# Patient Record
Sex: Female | Born: 1985 | Race: White | Hispanic: No | Marital: Married | Smoking: Never smoker
Health system: Southern US, Community
[De-identification: ages and names within clinical notes are randomized; demographics above are authoritative.]

## PROBLEM LIST (undated history)

## (undated) HISTORY — PX: ABLATION: SHX5711

## (undated) HISTORY — PX: TUBAL LIGATION: SHX77

## (undated) HISTORY — PX: APPENDECTOMY: SHX54

---

## 2004-10-01 ENCOUNTER — Emergency Department (HOSPITAL_COMMUNITY): Admission: EM | Admit: 2004-10-01 | Discharge: 2004-10-01 | Payer: Self-pay | Admitting: Emergency Medicine

## 2006-04-28 ENCOUNTER — Emergency Department (HOSPITAL_COMMUNITY): Admission: EM | Admit: 2006-04-28 | Discharge: 2006-04-28 | Payer: Self-pay | Admitting: Emergency Medicine

## 2007-12-30 ENCOUNTER — Emergency Department (HOSPITAL_COMMUNITY): Admission: EM | Admit: 2007-12-30 | Discharge: 2007-12-30 | Payer: Self-pay | Admitting: Emergency Medicine

## 2009-06-24 IMAGING — CR DG CHEST 2V
2 series · 2 of 2 positions shown · non-contrast
Comparison: None

CLINICAL DATA: Chest pain, shortness of breath

CHEST - 2 VIEW

[w chest pa]
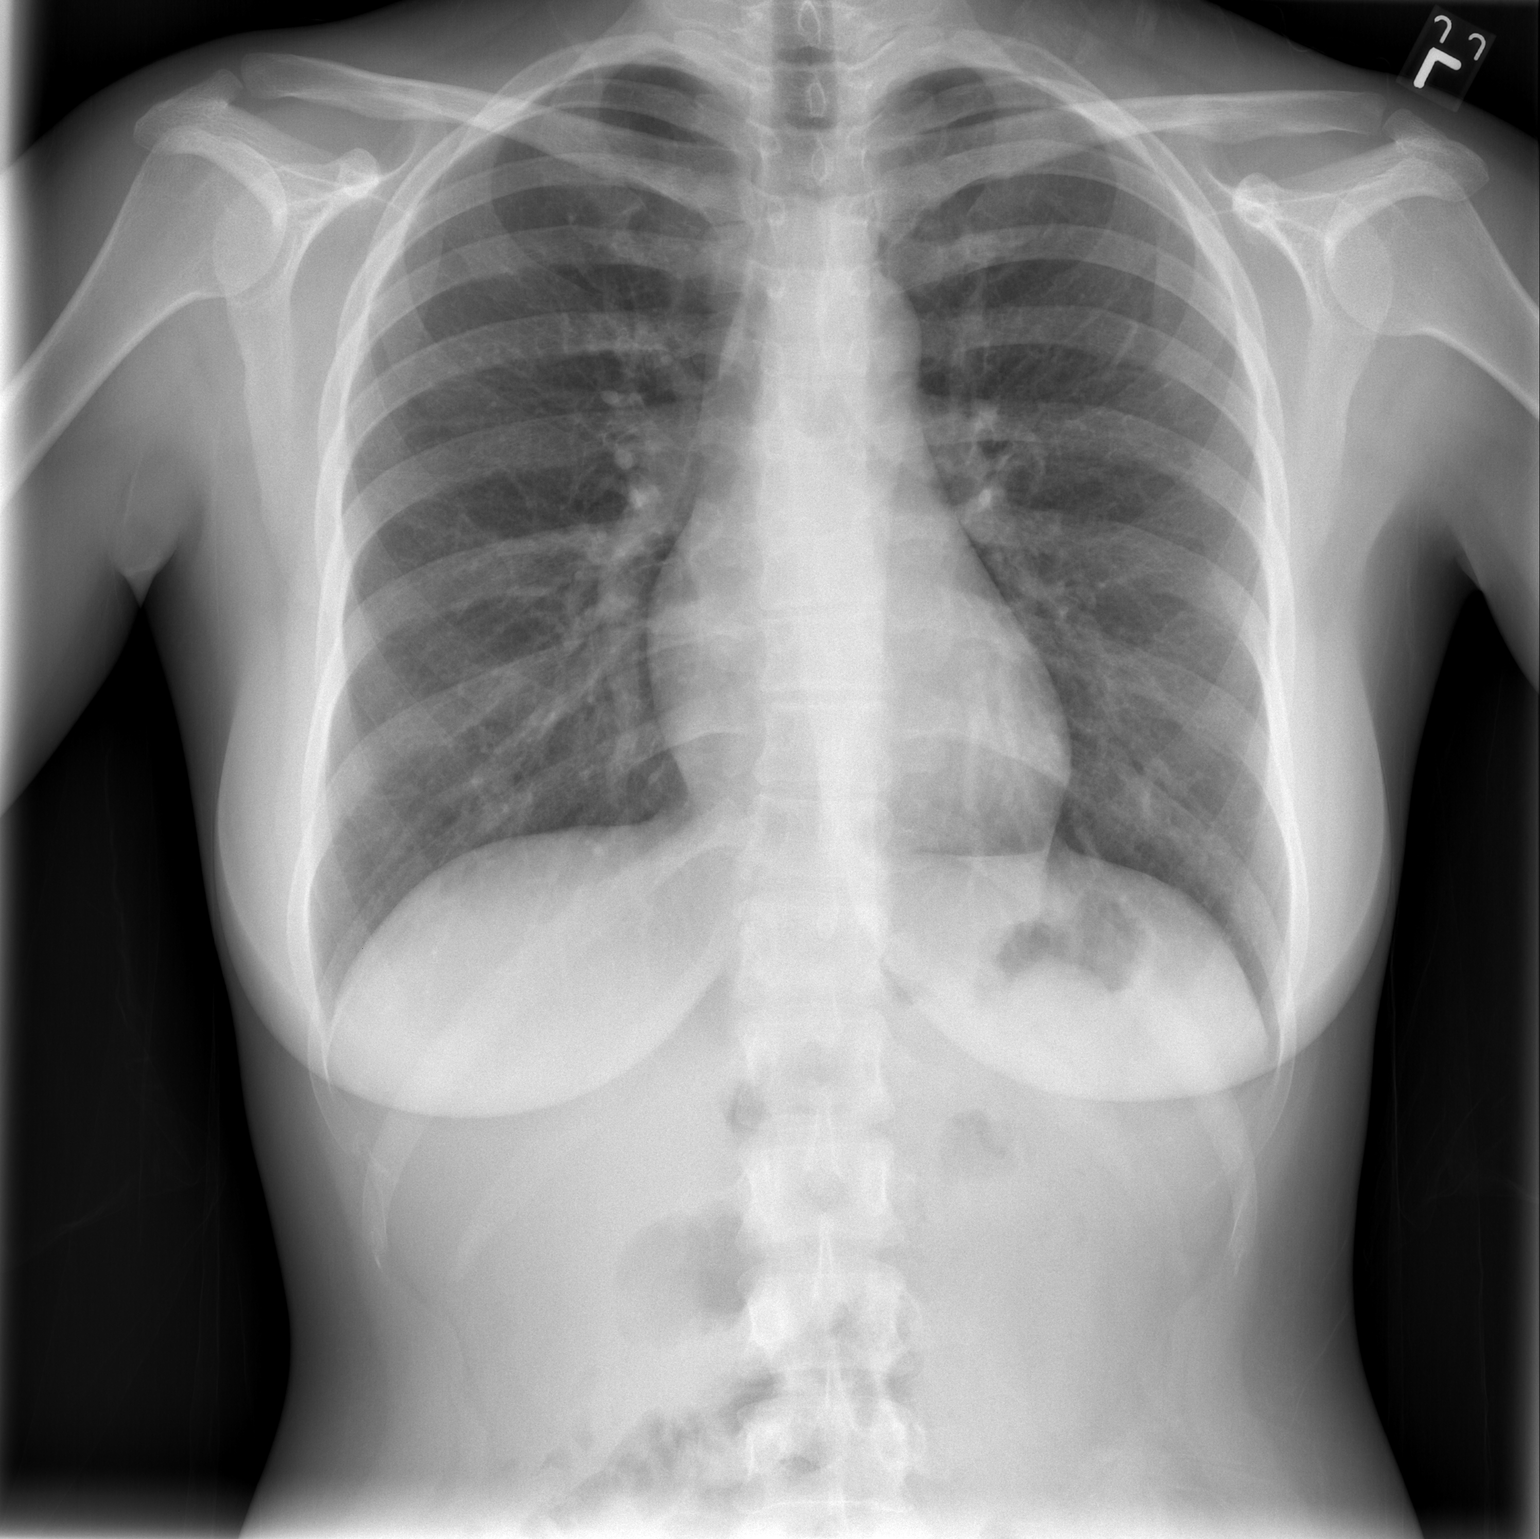

[w chest lat]
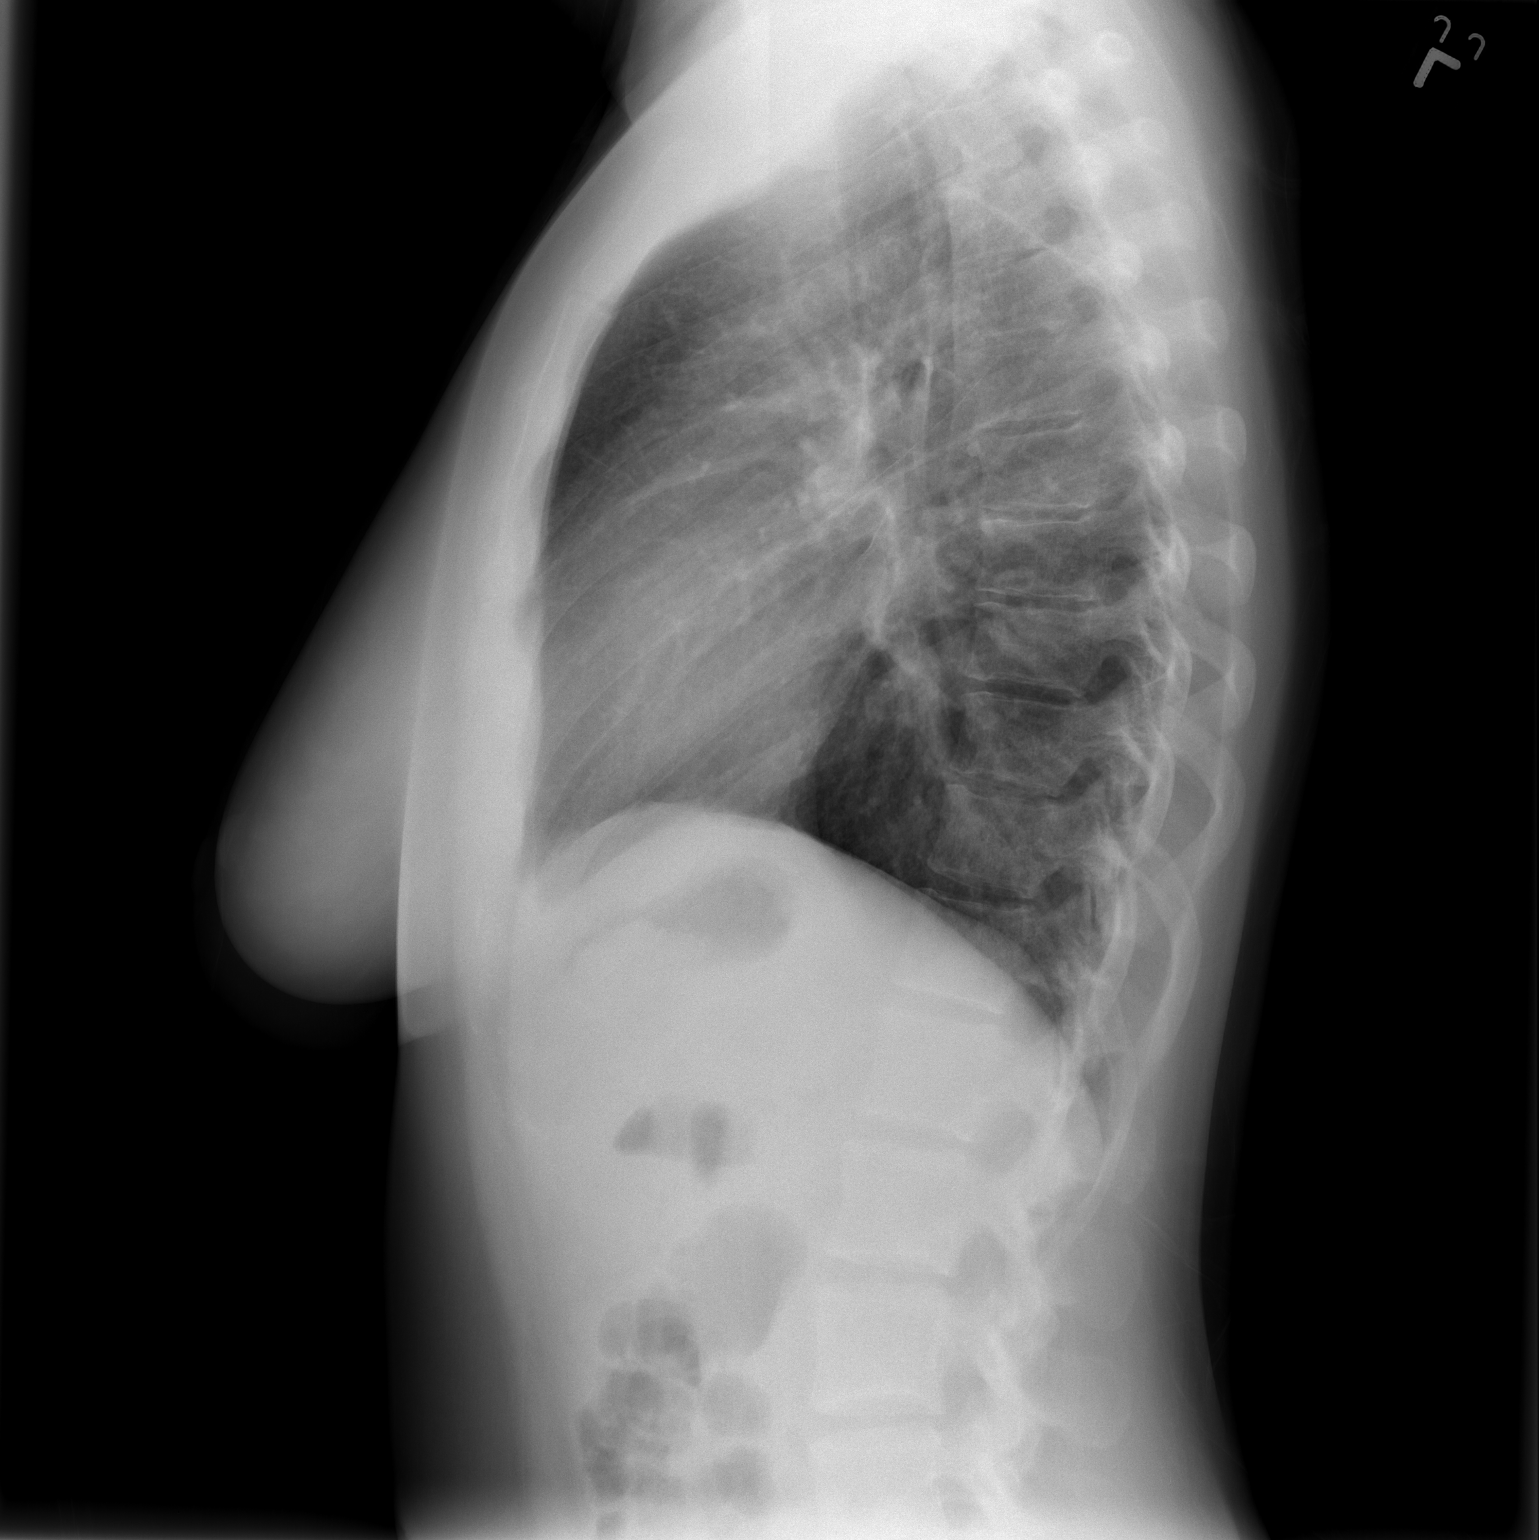

[2 of 2 positions shown; findings below may reference images not displayed]

FINDINGS: The lungs are clear.  Heart is within normal limits in
size.  No bony abnormality is seen.
IMPRESSION: No active lung disease.

## 2018-12-13 ENCOUNTER — Emergency Department (INDEPENDENT_AMBULATORY_CARE_PROVIDER_SITE_OTHER)
Admission: EM | Admit: 2018-12-13 | Discharge: 2018-12-13 | Disposition: A | Discharge status: Home / Self Care | Source: Home / Self Care | Attending: Family Medicine | Admitting: Family Medicine

## 2018-12-13 ENCOUNTER — Other Ambulatory Visit: Payer: Self-pay

## 2018-12-13 DIAGNOSIS — L255 Unspecified contact dermatitis due to plants, except food: Secondary | ICD-10-CM

## 2018-12-13 MED ORDER — METHYLPREDNISOLONE ACETATE 80 MG/ML IJ SUSP
80.0000 mg | Freq: Once | INTRAMUSCULAR | Status: AC
  Administered 2018-12-13: 13:00:00 80 mg via INTRAMUSCULAR

## 2018-12-13 NOTE — Discharge Instructions (Addendum)
May continue Allegra for itching.  May continue calamine lotion and/or 1% hydrocortisone cream.

## 2018-12-13 NOTE — ED Triage Notes (Signed)
Pt c/o poison ivy rash since yesterday. Husband got it and spread to her arms and lower legs. Using calamine lotion currently.

## 2018-12-13 NOTE — ED Provider Notes (Signed)
[facility]    CSN: [id number] Arrival date & time: 12/13/18  1208     History   Chief Complaint Chief Complaint  Patient presents with  . Poison Ivy    HPI Michele Harvey is a 33 y.o. female.   Patient's husband developed poison ivy rash after cutting trees 3 days ago.  Patient then developed vesicular pruritic rash on her arms and legs yesterday.  She feels well otherwise.  The history is provided by the patient.  Poison Lajoyce Cornersvy This is a new problem. The current episode started yesterday. The problem occurs constantly. The problem has been gradually worsening. Nothing aggravates the symptoms. Nothing relieves the symptoms. Treatments tried: calamine lotion and Allegra. The treatment provided mild relief.    History reviewed. No pertinent past medical history.  There are no active problems to display for this patient.   History reviewed. No pertinent surgical history.  OB History   No obstetric history on file.      Home Medications    Prior to Admission medications   Medication Sig Start Date End Date Taking? Authorizing Provider  venlafaxine (EFFEXOR) 75 MG tablet Take 75 mg by mouth 2 (two) times daily.   Yes [provider]    Family History History reviewed. No pertinent family history.  Social History Social History   Tobacco Use  . Smoking status: Never Smoker  . Smokeless tobacco: Never Used  Substance Use Topics  . Alcohol use: Not on file  . Drug use: Not on file     Allergies   Patient has no known allergies.   Review of Systems Review of Systems  All other systems reviewed and are negative.    Physical Exam Triage Vital Signs ED Triage Vitals  Enc Vitals Group     BP 12/13/18 1223 130/85     Pulse Rate 12/13/18 1223 79     Resp 12/13/18 1223 18     Temp 12/13/18 1223 (!) 97.4 F (36.3 C)     Temp Source 12/13/18 1223 Oral     SpO2 12/13/18 1223 99 %     Weight 12/13/18 1224 150 lb (68 kg)     Height  12/13/18 1224 5\' 3"  (1.6 m)     Head Circumference --      Peak Flow --      Pain Score 12/13/18 1224 0     Pain Loc --      Pain Edu? --      Excl. in GC? --    No data found.  Updated Vital Signs BP 130/85 (BP Location: Right Arm)   Pulse 79   Temp (!) 97.4 F (36.3 C) (Oral)   Resp 18   Ht 5\' 3"  (1.6 m)   Wt 68 kg   LMP  (LMP Unknown)   SpO2 99%   BMI 26.57 kg/m   Visual Acuity Right Eye Distance:   Left Eye Distance:   Bilateral Distance:    Right Eye Near:   Left Eye Near:    Bilateral Near:     Physical Exam Vitals signs reviewed.  Constitutional:      General: She is not in acute distress. HENT:     Head: Atraumatic.     Right Ear: External ear normal.     Left Ear: External ear normal.     Nose: Nose normal.  Eyes:     Pupils: Pupils are equal, round, and reactive to light.  Cardiovascular:  Rate and Rhythm: Normal rate.  Pulmonary:     Effort: Pulmonary effort is normal.  Musculoskeletal:        General: No swelling.  Skin:    General: Skin is warm and dry.          Comments: Scattered erythematous vesicular eruptions on arms and lower legs.  Few lesions on chest.  No tenderness to palpation or swelling.  Neurological:     Mental Status: She is alert.      UC Treatments / Results  Labs (all labs ordered are listed, but only abnormal results are displayed) Labs Reviewed - No data to display  EKG None  Radiology No results found.  Procedures Procedures (including critical care time)  Medications Ordered in UC Medications  methylPREDNISolone acetate (DEPO-MEDROL) injection 80 mg (has no administration in time range)    Initial Impression / Assessment and Plan / UC Course  I have reviewed the triage vital signs and the nursing notes.  Pertinent labs & imaging results that were available during my care of the patient were reviewed by me and considered in my medical decision making (see chart for details).    No evidence  cellulitis Administered Depo Medrol 80mg  IM  Call if not improving 3 to 4 days (could add prednisone burst/taper).   Final Clinical Impressions(s) / UC Diagnoses   Final diagnoses:  Rhus dermatitis     Discharge Instructions     May continue Allegra for itching.  May continue calamine lotion and/or 1% hydrocortisone cream.    ED Prescriptions    None         Kandra Nicolas, MD 12/13/18 1240

## 2021-02-11 ENCOUNTER — Ambulatory Visit

## 2021-02-23 ENCOUNTER — Emergency Department: Admit: 2021-02-23 | Payer: Self-pay

## 2021-02-23 ENCOUNTER — Emergency Department (INDEPENDENT_AMBULATORY_CARE_PROVIDER_SITE_OTHER)
Admission: EM | Admit: 2021-02-23 | Discharge: 2021-02-23 | Disposition: A | Discharge status: Home / Self Care | Source: Home / Self Care

## 2021-02-23 ENCOUNTER — Other Ambulatory Visit: Payer: Self-pay

## 2021-02-23 ENCOUNTER — Encounter: Payer: Self-pay | Admitting: Emergency Medicine

## 2021-02-23 DIAGNOSIS — L237 Allergic contact dermatitis due to plants, except food: Secondary | ICD-10-CM

## 2021-02-23 MED ORDER — PREDNISONE 20 MG PO TABS: ORAL_TABLET | ORAL | 0 refills | Status: AC

## 2021-02-23 MED ORDER — METHYLPREDNISOLONE SODIUM SUCC 125 MG IJ SOLR
125.0000 mg | Freq: Once | INTRAMUSCULAR | Status: AC
Start: 2021-02-23 — End: 2021-02-23
  Administered 2021-02-23: 125 mg via INTRAMUSCULAR

## 2021-02-23 NOTE — ED Triage Notes (Signed)
Patient presents to Urgent Care with complaints of rash since 2 weeks ago. Patient reports helping in the yard and started having rash on forearms, abdomen. Did go to urgent care then and received a steroid injection and symptoms did improve but flared back up on Thursday. Applied hydrocortisone, benadryl, Allegra. Areas are painful/burning.

## 2021-02-23 NOTE — ED Provider Notes (Signed)
[facility]    CSN: [id number] Arrival date & time: 02/23/21  0906      History   Chief Complaint Chief Complaint  Patient presents with   Rash    HPI Michele Harvey is a 35 y.o. female.   HPI 35 year old female presents with rash of bilateral forearms and abdomen for 2 weeks.  Patient reports presenting to urgent care for steroid injection at that time now rash has flared back up.  [facility] was [facility] at [facility] and prescribed Vistaril, prednisone taper.  History reviewed. No pertinent past medical history.  There are no problems to display for this patient.   Past Surgical History:  Procedure Laterality Date   ABLATION     APPENDECTOMY     TUBAL LIGATION      OB History   No obstetric history on file.      Home Medications    Prior to Admission medications   Medication Sig Start Date End Date Taking? Authorizing Provider  busPIRone (BUSPAR) 10 MG tablet Take 10 mg by mouth 3 (three) times daily. 12/02/20  Yes [provider]  predniSONE (DELTASONE) 20 MG tablet Take 3 tabs PO daily x 3 days, then 2 tabs PO daily x 3 days, then 1 tab PO daily x 3 days 02/23/21  Yes Trevor Iha, FNP  venlafaxine (EFFEXOR) 75 MG tablet Take 75 mg by mouth 2 (two) times daily.    [provider]    Family History History reviewed. No pertinent family history.  Social History Social History   Tobacco Use   Smoking status: Never   Smokeless tobacco: Never  Vaping Use   Vaping Use: Never used  Substance Use Topics   Alcohol use: Not Currently   Drug use: Never     Allergies   Patient has no known allergies.   Review of Systems Review of Systems  Skin:  Positive for rash.  All other systems reviewed and are negative.   Physical Exam Triage Vital Signs ED Triage Vitals  Enc Vitals Group     BP 02/23/21 0919 (!) 128/91     Pulse Rate 02/23/21 0919 77     Resp 02/23/21 0919 16     Temp 02/23/21 0919 97.6 F (36.4 C)     Temp Source  02/23/21 0919 Oral     SpO2 02/23/21 0919 100 %     Weight --      Height --      Head Circumference --      Peak Flow --      Pain Score 02/23/21 0915 7     Pain Loc --      Pain Edu? --      Excl. in GC? --    No data found.  Updated Vital Signs BP (!) 128/91 (BP Location: Left Arm)   Pulse 77   Temp 97.6 F (36.4 C) (Oral)   Resp 16   SpO2 100%    Physical Exam Vitals and nursing note reviewed.  Constitutional:      General: She is not in acute distress.    Appearance: Normal appearance. She is normal weight. She is not ill-appearing.  HENT:     Head: Normocephalic and atraumatic.     Mouth/Throat:     Mouth: Mucous membranes are moist.     Pharynx: Oropharynx is clear.  Eyes:     Extraocular Movements: Extraocular movements intact.     Conjunctiva/sclera: Conjunctivae normal.  Pupils: Pupils are equal, round, and reactive to light.  Cardiovascular:     Rate and Rhythm: Normal rate and regular rhythm.     Pulses: Normal pulses.     Heart sounds: Normal heart sounds. No murmur heard. Pulmonary:     Effort: Pulmonary effort is normal.     Breath sounds: No wheezing, rhonchi or rales.  Musculoskeletal:        General: Normal range of motion.     Cervical back: Normal range of motion and neck supple.  Skin:    General: Skin is warm and dry.     Comments: Bilateral upper/lower arms (volar aspect), abdomen (lower left-sided): Pruritic erythematous maculopapular eruption, with grouped linear vesicular lesions.  Neurological:     General: No focal deficit present.     Mental Status: She is alert and oriented to person, place, and time. Mental status is at baseline.  Psychiatric:        Mood and Affect: Mood normal.        Behavior: Behavior normal.        Thought Content: Thought content normal.     UC Treatments / Results  Labs (all labs ordered are listed, but only abnormal results are displayed) Labs Reviewed - No data to  display  EKG   Radiology No results found.  Procedures Procedures (including critical care time)  Medications Ordered in UC Medications  methylPREDNISolone sodium succinate (SOLU-MEDROL) 125 mg/2 mL injection 125 mg (125 mg Intramuscular Given 02/23/21 0955)    Initial Impression / Assessment and Plan / UC Course  I have reviewed the triage vital signs and the nursing notes.  Pertinent labs & imaging results that were available during my care of the patient were reviewed by me and considered in my medical decision making (see chart for details).     MDM: 1.  Poison ivy dermatitis-IM Solu-Medrol 125 given once and in clinic, Rx'd prednisone taper; Advised patient to start prednisone taper at 1 PM today.  Advised/instructed patient to take medication as directed with food to completion.  Encouraged patient to increase daily water intake while taking this medication.  Advised/instructed patient to change bed linens for the next 3 days to avoid recontamination.  Patient discharged home hemodynamically stable. Final Clinical Impressions(s) / UC Diagnoses   Final diagnoses:  Poison ivy dermatitis     Discharge Instructions      Advised patient to start prednisone taper at 1 PM today.  Advised/instructed patient to take medication as directed with food to completion.  Encouraged patient to increase daily water intake while taking this medication.  Advised/instructed patient to change bed linens for the next 3 days to avoid recontamination.     ED Prescriptions     Medication Sig Dispense Auth. Provider   predniSONE (DELTASONE) 20 MG tablet Take 3 tabs PO daily x 3 days, then 2 tabs PO daily x 3 days, then 1 tab PO daily x 3 days 18 tablet Trevor Iha, FNP      PDMP not reviewed this encounter.   Trevor Iha, FNP 02/23/21 1701

## 2021-02-23 NOTE — Discharge Instructions (Addendum)
Advised patient to start prednisone taper at 1 PM today.  Advised/instructed patient to take medication as directed with food to completion.  Encouraged patient to increase daily water intake while taking this medication.  Advised/instructed patient to change bed linens for the next 3 days to avoid recontamination.
